# Patient Record
Sex: Female | Born: 1990 | Race: Black or African American | Hispanic: No | Marital: Single | Smoking: Never smoker
Health system: Southern US, Community
[De-identification: ages and names within clinical notes are randomized; demographics above are authoritative.]

## PROBLEM LIST (undated history)

## (undated) HISTORY — PX: WISDOM TOOTH EXTRACTION: SHX21

---
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
## 2012-05-20 ENCOUNTER — Emergency Department (HOSPITAL_BASED_OUTPATIENT_CLINIC_OR_DEPARTMENT_OTHER)
Admission: EM | Admit: 2012-05-20 | Discharge: 2012-05-20 | Disposition: A | Discharge status: Home / Self Care | Payer: Self-pay | Attending: Emergency Medicine | Admitting: Emergency Medicine

## 2012-05-20 ENCOUNTER — Encounter (HOSPITAL_BASED_OUTPATIENT_CLINIC_OR_DEPARTMENT_OTHER): Payer: Self-pay | Admitting: *Deleted

## 2012-05-20 DIAGNOSIS — R109 Unspecified abdominal pain: Secondary | ICD-10-CM | POA: Insufficient documentation

## 2012-05-20 LAB — COMPREHENSIVE METABOLIC PANEL
AST: 17 U/L (ref 0–37)
Albumin: 4 g/dL (ref 3.5–5.2)
BUN: 9 mg/dL (ref 6–23)
CO2: 22 mEq/L (ref 19–32)
Calcium: 9.2 mg/dL (ref 8.4–10.5)
Creatinine, Ser: 0.9 mg/dL (ref 0.50–1.10)
GFR calc non Af Amer: >90 mL/min (ref >90)

## 2012-05-20 LAB — URINALYSIS, ROUTINE W REFLEX MICROSCOPIC
Ketones, ur: NEGATIVE mg/dL
Leukocytes, UA: NEGATIVE
Nitrite: NEGATIVE
Protein, ur: NEGATIVE mg/dL

## 2012-05-20 LAB — URINE MICROSCOPIC-ADD ON

## 2012-05-20 LAB — CBC WITH DIFFERENTIAL/PLATELET
Basophils Absolute: 0 10*3/uL (ref 0.0–0.1)
Eosinophils Relative: 1 % (ref 0–5)
Lymphocytes Relative: 36 % (ref 12–46)
Lymphs Abs: 2.1 10*3/uL (ref 0.7–4.0)
MCV: 80.8 fL (ref 78.0–100.0)
Neutro Abs: 3.3 10*3/uL (ref 1.7–7.7)
Platelets: 289 10*3/uL (ref 150–400)
RBC: 4.47 MIL/uL (ref 3.87–5.11)
WBC: 5.7 10*3/uL (ref 4.0–10.5)

## 2012-05-20 LAB — LIPASE, BLOOD: Lipase: 54 U/L (ref 11–59)

## 2012-05-20 LAB — PREGNANCY, URINE: Preg Test, Ur: NEGATIVE

## 2012-05-20 MED ORDER — SODIUM CHLORIDE 0.9 % IV SOLN
1000.0000 mL | INTRAVENOUS | Status: DC
  Administered 2012-05-20: 1000 mL via INTRAVENOUS

## 2012-05-20 MED ORDER — OMEPRAZOLE 40 MG PO CPDR: 40.0000 mg | DELAYED_RELEASE_CAPSULE | Freq: Every day | ORAL | Status: DC

## 2012-05-20 MED ORDER — SODIUM CHLORIDE 0.9 % IV SOLN
1000.0000 mL | Freq: Once | INTRAVENOUS | Status: AC
  Administered 2012-05-20: 1000 mL via INTRAVENOUS

## 2012-05-20 NOTE — ED Provider Notes (Signed)
History     CSN: [id number] Arrival date & time 05/20/12  4098  First MD Initiated Contact with Patient 05/20/12 [phone]      Chief Complaint  Patient presents with  . Abdominal Pain    HPI Pt has been having issues with abdominal pain after eating for the last several months.  It comes and goes but usually after eating.  The pain is mostly located in the upper abdomen.  It does not radiate.  The pain lasts for 5-10 minutes. No fever, no cough.  She had an episode when she woke up this am so she came to the ED.  Pt has noticed some spotting while she has been on the depo shot but that did not bring her to the ED. History reviewed. No pertinent past medical history.  History reviewed. No pertinent past surgical history.  History reviewed. No pertinent family history.  History  Substance Use Topics  . Smoking status: Never Smoker   . Smokeless tobacco: Not on file  . Alcohol Use: No    OB History    Grav Para Term Preterm Abortions TAB SAB Ect Mult Living                  Review of Systems  All other systems reviewed and are negative.    Allergies  Review of patient's allergies indicates no known allergies.  Home Medications  No current outpatient prescriptions on file.  BP 111/61  Pulse 88  Temp 98.6 F (37 C) (Oral)  Resp 14  SpO2 100%  LMP 04/18/2012  Physical Exam  Nursing note and vitals reviewed. Constitutional: She appears well-developed and well-nourished. No distress.  HENT:  Head: Normocephalic and atraumatic.  Right Ear: External ear normal.  Left Ear: External ear normal.  Eyes: Conjunctivae normal are normal. Right eye exhibits no discharge. Left eye exhibits no discharge. No scleral icterus.  Neck: Neck supple. No tracheal deviation present.  Cardiovascular: Normal rate, regular rhythm and intact distal pulses.   Pulmonary/Chest: Effort normal and breath sounds normal. No stridor. No respiratory distress. She has no wheezes. She has no rales.    Abdominal: Soft. Bowel sounds are normal. She exhibits no distension. There is no tenderness. There is no rebound and no guarding.  Musculoskeletal: She exhibits no edema and no tenderness.  Neurological: She is alert. She has normal strength. No sensory deficit. Cranial nerve deficit:  no gross defecits noted. She exhibits normal muscle tone. She displays no seizure activity. Coordination normal.  Skin: Skin is warm and dry. No rash noted.  Psychiatric: She has a normal mood and affect.    ED Course  Procedures (including critical care time)  Labs Reviewed  URINALYSIS, ROUTINE W REFLEX MICROSCOPIC - Abnormal; Notable for the following:    APPearance CLOUDY (*)     Hgb urine dipstick LARGE (*)     All other components within normal limits  URINE MICROSCOPIC-ADD ON - Abnormal; Notable for the following:    Bacteria, UA FEW (*)     All other components within normal limits  PREGNANCY, URINE  COMPREHENSIVE METABOLIC PANEL  LIPASE, BLOOD  CBC WITH DIFFERENTIAL   No results found.   1. Abdominal pain       MDM  Abdominal pain Benign exam.  No ttp in the ED.  Related to foods.  Could be GERD, Gastritis, PUD.  Less likely gallbladder etiology.  At this time there does not appear to be any evidence of an acute emergency  medical condition and the patient appears stable for discharge with appropriate outpatient follow up.  Hematuria Related to her menstrual spotting.  Doubt renal colic. uti.         Celene Kras, MD 05/20/12 [phone]

## 2012-05-20 NOTE — ED Notes (Signed)
Pt states she started depo shots last month while on her period she reports has been having intermittent nausea since then and is spotting now. No emesis denies any urinary s/s

## 2013-01-05 ENCOUNTER — Emergency Department (HOSPITAL_COMMUNITY): Payer: Managed Care, Other (non HMO)

## 2013-01-05 ENCOUNTER — Encounter (HOSPITAL_COMMUNITY): Payer: Self-pay | Admitting: Emergency Medicine

## 2013-01-05 ENCOUNTER — Emergency Department (HOSPITAL_COMMUNITY)
Admission: EM | Admit: 2013-01-05 | Discharge: 2013-01-06 | Disposition: A | Discharge status: Home / Self Care | Payer: Managed Care, Other (non HMO) | Attending: Emergency Medicine | Admitting: Emergency Medicine

## 2013-01-05 DIAGNOSIS — R109 Unspecified abdominal pain: Secondary | ICD-10-CM

## 2013-01-05 DIAGNOSIS — M7989 Other specified soft tissue disorders: Secondary | ICD-10-CM | POA: Insufficient documentation

## 2013-01-05 DIAGNOSIS — R0789 Other chest pain: Secondary | ICD-10-CM | POA: Insufficient documentation

## 2013-01-05 DIAGNOSIS — R079 Chest pain, unspecified: Secondary | ICD-10-CM

## 2013-01-05 LAB — CBC
Hemoglobin: 14.1 g/dL (ref 12.0–15.0)
MCH: 30.5 pg (ref 26.0–34.0)
MCHC: 36.1 g/dL — ABNORMAL HIGH (ref 30.0–36.0)
Platelets: 307 10*3/uL (ref 150–400)
RDW: 13.2 % (ref 11.5–15.5)

## 2013-01-05 LAB — BASIC METABOLIC PANEL
Calcium: 9.3 mg/dL (ref 8.4–10.5)
GFR calc Af Amer: >90 mL/min (ref >90)
GFR calc non Af Amer: >90 mL/min (ref >90)
Potassium: 3.4 mEq/L — ABNORMAL LOW (ref 3.5–5.1)
Sodium: 137 mEq/L (ref 135–145)

## 2013-01-05 LAB — POCT I-STAT TROPONIN I: Troponin i, poc: 0 ng/mL (ref 0.00–0.08)

## 2013-01-05 MED ORDER — RANITIDINE HCL 150 MG PO TABS: 150.0000 mg | ORAL_TABLET | Freq: Two times a day (BID) | ORAL | Status: DC
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
## 2013-01-05 NOTE — ED Notes (Signed)
Patient with chest pain and new onset of ankle swelling.  Patient states that she travelled to [place] and came back about one week ago and notice the swelling in ankles.  Patient states she was not on any prophylaxis for any infectious diseases while in [place].  Patient states she was there for 6 weeks.  Patient states she is from [place].

## 2013-02-07 ENCOUNTER — Encounter (HOSPITAL_COMMUNITY): Payer: Self-pay | Admitting: Emergency Medicine

## 2013-02-07 ENCOUNTER — Emergency Department (HOSPITAL_COMMUNITY): Payer: Managed Care, Other (non HMO)

## 2013-02-07 ENCOUNTER — Emergency Department (HOSPITAL_COMMUNITY)
Admission: EM | Admit: 2013-02-07 | Discharge: 2013-02-07 | Disposition: A | Discharge status: Home / Self Care | Payer: Managed Care, Other (non HMO) | Attending: Emergency Medicine | Admitting: Emergency Medicine

## 2013-02-07 DIAGNOSIS — IMO0002 Reserved for concepts with insufficient information to code with codable children: Secondary | ICD-10-CM | POA: Insufficient documentation

## 2013-02-07 DIAGNOSIS — S3630XA Unspecified injury of stomach, initial encounter: Secondary | ICD-10-CM | POA: Insufficient documentation

## 2013-02-07 DIAGNOSIS — Z79899 Other long term (current) drug therapy: Secondary | ICD-10-CM | POA: Insufficient documentation

## 2013-02-07 DIAGNOSIS — Y92410 Unspecified street and highway as the place of occurrence of the external cause: Secondary | ICD-10-CM | POA: Insufficient documentation

## 2013-02-07 DIAGNOSIS — Y9389 Activity, other specified: Secondary | ICD-10-CM | POA: Insufficient documentation

## 2013-02-07 DIAGNOSIS — T148XXA Other injury of unspecified body region, initial encounter: Secondary | ICD-10-CM

## 2013-02-07 DIAGNOSIS — Z3202 Encounter for pregnancy test, result negative: Secondary | ICD-10-CM | POA: Insufficient documentation

## 2013-02-07 LAB — CBC WITH DIFFERENTIAL/PLATELET
Basophils Relative: 0 % (ref 0–1)
Eosinophils Absolute: 0.1 10*3/uL (ref 0.0–0.7)
Eosinophils Relative: 1 % (ref 0–5)
Hemoglobin: 14.2 g/dL (ref 12.0–15.0)
Lymphs Abs: 1.7 10*3/uL (ref 0.7–4.0)
MCH: 31 pg (ref 26.0–34.0)
MCHC: 35.9 g/dL (ref 30.0–36.0)
MCV: 86.5 fL (ref 78.0–100.0)
Monocytes Relative: 8 % (ref 3–12)
Neutrophils Relative %: 59 % (ref 43–77)
Platelets: 272 10*3/uL (ref 150–400)
RBC: 4.58 MIL/uL (ref 3.87–5.11)

## 2013-02-07 LAB — URINE MICROSCOPIC-ADD ON

## 2013-02-07 LAB — BASIC METABOLIC PANEL
BUN: 7 mg/dL (ref 6–23)
Calcium: 9.3 mg/dL (ref 8.4–10.5)
GFR calc Af Amer: >90 mL/min (ref >90)
GFR calc non Af Amer: 88 mL/min — ABNORMAL LOW (ref >90)
Glucose, Bld: 75 mg/dL (ref 70–99)

## 2013-02-07 LAB — URINALYSIS, ROUTINE W REFLEX MICROSCOPIC
Bilirubin Urine: NEGATIVE
Hgb urine dipstick: NEGATIVE
Ketones, ur: NEGATIVE mg/dL
Nitrite: NEGATIVE
Specific Gravity, Urine: 1.028 (ref 1.005–1.030)
Urobilinogen, UA: 1 mg/dL (ref 0.0–1.0)

## 2013-02-07 MED ORDER — IOHEXOL 300 MG/ML  SOLN
100.0000 mL | Freq: Once | INTRAMUSCULAR | Status: AC | PRN
  Administered 2013-02-07: 100 mL via INTRAVENOUS

## 2013-02-07 MED ORDER — HYDROCODONE-ACETAMINOPHEN 5-325 MG PO TABS: 1.0000 | ORAL_TABLET | Freq: Four times a day (QID) | ORAL | Status: DC | PRN
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
## 2013-02-07 NOTE — ED Notes (Signed)
Pt moved to POD E at [facility] request.
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
## 2013-02-08 NOTE — ED Provider Notes (Signed)
Medical screening examination/treatment/procedure(s) were performed by non-physician practitioner and as supervising physician I was immediately available for consultation/collaboration.  Ranelle Auker R. Laurel Smeltz, MD 02/08/13 1532 

## 2014-12-28 ENCOUNTER — Ambulatory Visit (INDEPENDENT_AMBULATORY_CARE_PROVIDER_SITE_OTHER): Payer: BLUE CROSS/BLUE SHIELD | Admitting: Family Medicine

## 2014-12-28 VITALS — BP 108/74 | HR 84 | Temp 98.7°F | Resp 16 | Ht 63.0 in | Wt 143.2 lb

## 2014-12-28 DIAGNOSIS — Z0000 Encounter for general adult medical examination without abnormal findings: Secondary | ICD-10-CM

## 2014-12-28 NOTE — Progress Notes (Signed)
Subjective:  This chart was scribed for Norberto Sorenson, MD by Stann Ore, Medical Scribe. This patient was seen in Room 10 and the patient's care was started 4:52 PM.    Patient ID: Christina Day, female    DOB: Nov 22, 1990, 24 y.o.   MRN: [id number] Chief Complaint  Patient presents with  . Employment Physical    HPI Christina Day is a 24 y.o. female who presents to [facility] for employment physical. 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  She has no medication problems prior. She's gotten her immunizations when she was young.  She's had a TB test yesterday done at [facility] in [facility], but she didn't have her physical forms until today. She doesn't recall having chicken pox when she was young. She denies any substance usage. Her last tetanus shot was in 2012.  She has an appointment with OB GYN next week for pap smear.   She grew up in [place].    History reviewed. No pertinent past medical history. Current Outpatient Prescriptions on File Prior to Visit  Medication Sig Dispense Refill  . MedroxyPROGESTERone Acetate (DEPO-PROVERA IM) Inject into the muscle every 3 (three) months.     No current facility-administered medications on file prior to visit.   No Known Allergies  History reviewed. No pertinent past surgical history. No family history on file. History   Social History  . Marital Status: Single    Spouse Name: N/A  . Number of Children: N/A  . Years of Education: N/A   Social History Main Topics  . Smoking status: Never Smoker   . Smokeless tobacco: Not on file  . Alcohol Use: No  . Drug Use: No  . Sexual Activity: Yes    Birth Control/ Protection: Injection   Other Topics Concern  . None   Social History Narrative     Review of Systems  Constitutional: Negative for fever, chills, diaphoresis and fatigue.  HENT: Negative for congestion, ear pain, rhinorrhea, sinus pressure, sneezing and sore throat.   Gastrointestinal: Negative for nausea, vomiting, diarrhea and  constipation.  Skin: Negative for rash and wound.  All other systems reviewed and are negative.      Objective:   Physical Exam  Constitutional: She is oriented to person, place, and time. She appears well-developed and well-nourished. No distress.  HENT:  Head: Normocephalic and atraumatic.  Right Ear: Tympanic membrane normal.  Left Ear: Tympanic membrane normal.  Nose: Nose normal.  Mouth/Throat: No oropharyngeal exudate.  Eyes: EOM are normal. Pupils are equal, round, and reactive to light.  Neck: Normal range of motion. Neck supple. No thyromegaly present.  Cardiovascular: Normal rate, regular rhythm, S1 normal, S2 normal and normal heart sounds.   Pulses:      Dorsalis pedis pulses are 2+ on the right side, and 2+ on the left side.  Pulmonary/Chest: Effort normal and breath sounds normal. No respiratory distress.  Musculoskeletal: Normal range of motion.  Lymphadenopathy:    She has no cervical adenopathy.  Neurological: She is alert and oriented to person, place, and time.  Reflex Scores:      Patellar reflexes are 2+ on the right side and 2+ on the left side.      Achilles reflexes are 2+ on the right side and 2+ on the left side. Skin: Skin is warm and dry.  Psychiatric: She has a normal mood and affect. Her behavior is normal.  Nursing note and vitals reviewed.   BP 108/74 mmHg  Pulse 84  Temp(Src) 98.7 F (37.1 C) (Oral)  Resp 16  Ht 5\' 3"  (1.6 m)  Wt 143 lb 3.2 oz (64.955 kg)  BMI 25.37 kg/m2  SpO2 97%  LMP 12/23/2014       Assessment & Plan:   1. Health maintenance examination   work forms completed, no restrictions, copy scanned.   I personally performed the services described in this documentation, which was scribed in my presence. The recorded information has been reviewed and considered, and addended by me as needed.  Norberto SorensonEva Cherrise Occhipinti, MD MPH

## 2014-12-28 NOTE — Patient Instructions (Signed)
We did bill for a full physical today since this was required by your work but no labs were done and neither wore a screening pelvic or breast exam so hopefully your gynecologist will be able to cover all of this - if any problems, please call.  Health Maintenance Adopting a healthy lifestyle and getting preventive care can go a long way to promote health and wellness. Talk with your health care provider about what schedule of regular examinations is right for you. This is a good chance for you to check in with your provider about disease prevention and staying healthy. In between checkups, there are plenty of things you can do on your own. Experts have done a lot of research about which lifestyle changes and preventive measures are most likely to keep you healthy. Ask your health care provider for more information. WEIGHT AND DIET  Eat a healthy diet  Be sure to include plenty of vegetables, fruits, low-fat dairy products, and lean protein.  Do not eat a lot of foods high in solid fats, added sugars, or salt.  Get regular exercise. This is one of the most important things you can do for your health.  Most adults should exercise for at least 150 minutes each week. The exercise should increase your heart rate and make you sweat (moderate-intensity exercise).  Most adults should also do strengthening exercises at least twice a week. This is in addition to the moderate-intensity exercise.  Maintain a healthy weight  Body mass index (BMI) is a measurement that can be used to identify possible weight problems. It estimates body fat based on height and weight. Your health care provider can help determine your BMI and help you achieve or maintain a healthy weight.  For females 73 years of age and older:   A BMI below 18.5 is considered underweight.  A BMI of 18.5 to 24.9 is normal.  A BMI of 25 to 29.9 is considered overweight.  A BMI of 30 and above is considered obese.  Watch levels of  cholesterol and blood lipids  You should start having your blood tested for lipids and cholesterol at 22 years of age, then have this test every 5 years.  You may need to have your cholesterol levels checked more often if:  Your lipid or cholesterol levels are high.  You are older than 22 years of age.  You are at high risk for heart disease.  CANCER SCREENING   Lung Cancer  Lung cancer screening is recommended for adults 81-63 years old who are at high risk for lung cancer because of a history of smoking.  A yearly low-dose CT scan of the lungs is recommended for people who:  Currently smoke.  Have quit within the past 15 years.  Have at least a 30-pack-year history of smoking. A pack year is smoking an average of one pack of cigarettes a day for 1 year.  Yearly screening should continue until it has been 15 years since you quit.  Yearly screening should stop if you develop a health problem that would prevent you from having lung cancer treatment.  Breast Cancer  Practice breast self-awareness. This means understanding how your breasts normally appear and feel.  It also means doing regular breast self-exams. Let your health care provider know about any changes, no matter how small.  If you are in your 20s or 30s, you should have a clinical breast exam (CBE) by a health care provider every 1-3 years as part of  regular health exam.  If you are 40 or older, have a CBE every year. Also consider having a breast X-ray (mammogram) every year.  If you have a family history of breast cancer, talk to your health care provider about genetic screening.  If you are at high risk for breast cancer, talk to your health care provider about having an MRI and a mammogram every year.  Breast cancer gene (BRCA) assessment is recommended for women who have family members with BRCA-related cancers. BRCA-related cancers include:  Breast.  Ovarian.  Tubal.  Peritoneal cancers.  Results of the  assessment will determine the need for genetic counseling and BRCA1 and BRCA2 testing. Cervical Cancer Routine pelvic examinations to screen for cervical cancer are no longer recommended for nonpregnant women who are considered low risk for cancer of the pelvic organs (ovaries, uterus, and vagina) and who do not have symptoms. A pelvic examination may be necessary if you have symptoms including those associated with pelvic infections. Ask your health care provider if a screening pelvic exam is right for you.   The Pap test is the screening test for cervical cancer for women who are considered at risk.  If you had a hysterectomy for a problem that was not cancer or a condition that could lead to cancer, then you no longer need Pap tests.  If you are older than 65 years, and you have had normal Pap tests for the past 10 years, you no longer need to have Pap tests.  If you have had past treatment for cervical cancer or a condition that could lead to cancer, you need Pap tests and screening for cancer for at least 20 years after your treatment.  If you no longer get a Pap test, assess your risk factors if they change (such as having a new sexual partner). This can affect whether you should start being screened again.  Some women have medical problems that increase their chance of getting cervical cancer. If this is the case for you, your health care provider may recommend more frequent screening and Pap tests.  The human papillomavirus (HPV) test is another test that may be used for cervical cancer screening. The HPV test looks for the virus that can cause cell changes in the cervix. The cells collected during the Pap test can be tested for HPV.  The HPV test can be used to screen women 30 years of age and older. Getting tested for HPV can extend the interval between normal Pap tests from three to five years.  An HPV test also should be used to screen women of any age who have unclear Pap test  results.  After 22 years of age, women should have HPV testing as often as Pap tests.  Colorectal Cancer  This type of cancer can be detected and often prevented.  Routine colorectal cancer screening usually begins at 22 years of age and continues through 22 years of age.  Your health care provider may recommend screening at an earlier age if you have risk factors for colon cancer.  Your health care provider may also recommend using home test kits to check for hidden blood in the stool.  A small camera at the end of a tube can be used to examine your colon directly (sigmoidoscopy or colonoscopy). This is done to check for the earliest forms of colorectal cancer.  Routine screening usually begins at age 50.  Direct examination of the colon should be repeated every 5-10 years through 22   years of age. However, you may need to be screened more often if early forms of precancerous polyps or small growths are found. Skin Cancer  Check your skin from head to toe regularly.  Tell your health care provider about any new moles or changes in moles, especially if there is a change in a mole's shape or color.  Also tell your health care provider if you have a mole that is larger than the size of a pencil eraser.  Always use sunscreen. Apply sunscreen liberally and repeatedly throughout the day.  Protect yourself by wearing long sleeves, pants, a wide-brimmed hat, and sunglasses whenever you are outside. HEART DISEASE, DIABETES, AND HIGH BLOOD PRESSURE   Have your blood pressure checked at least every 1-2 years. High blood pressure causes heart disease and increases the risk of stroke.  If you are between 55 years and 79 years old, ask your health care provider if you should take aspirin to prevent strokes.  Have regular diabetes screenings. This involves taking a blood sample to check your fasting blood sugar level.  If you are at a normal weight and have a low risk for diabetes, have this  test once every three years after 22 years of age.  If you are overweight and have a high risk for diabetes, consider being tested at a younger age or more often. PREVENTING INFECTION  Hepatitis B  If you have a higher risk for hepatitis B, you should be screened for this virus. You are considered at high risk for hepatitis B if:  You were born in a country where hepatitis B is common. Ask your health care provider which countries are considered high risk.  Your parents were born in a high-risk country, and you have not been immunized against hepatitis B (hepatitis B vaccine).  You have HIV or AIDS.  You use needles to inject street drugs.  You live with someone who has hepatitis B.  You have had sex with someone who has hepatitis B.  You get hemodialysis treatment.  You take certain medicines for conditions, including cancer, organ transplantation, and autoimmune conditions. Hepatitis C  Blood testing is recommended for:  Everyone born from 1945 through 1965.  Anyone with known risk factors for hepatitis C. Sexually transmitted infections (STIs)  You should be screened for sexually transmitted infections (STIs) including gonorrhea and chlamydia if:  You are sexually active and are younger than 22 years of age.  You are older than 22 years of age and your health care provider tells you that you are at risk for this type of infection.  Your sexual activity has changed since you were last screened and you are at an increased risk for chlamydia or gonorrhea. Ask your health care provider if you are at risk.  If you do not have HIV, but are at risk, it may be recommended that you take a prescription medicine daily to prevent HIV infection. This is called pre-exposure prophylaxis (PrEP). You are considered at risk if:  You are sexually active and do not regularly use condoms or know the HIV status of your partner(s).  You take drugs by injection.  You are sexually active with  a partner who has HIV. Talk with your health care provider about whether you are at high risk of being infected with HIV. If you choose to begin PrEP, you should first be tested for HIV. You should then be tested every 3 months for as long as you are taking PrEP.    PREGNANCY   If you are premenopausal and you may become pregnant, ask your health care provider about preconception counseling.  If you may become pregnant, take 400 to 800 micrograms (mcg) of folic acid every day.  If you want to prevent pregnancy, talk to your health care provider about birth control (contraception). OSTEOPOROSIS AND MENOPAUSE   Osteoporosis is a disease in which the bones lose minerals and strength with aging. This can result in serious bone fractures. Your risk for osteoporosis can be identified using a bone density scan.  If you are 44 years of age or older, or if you are at risk for osteoporosis and fractures, ask your health care provider if you should be screened.  Ask your health care provider whether you should take a calcium or vitamin D supplement to lower your risk for osteoporosis.  Menopause may have certain physical symptoms and risks.  Hormone replacement therapy may reduce some of these symptoms and risks. Talk to your health care provider about whether hormone replacement therapy is right for you.  HOME CARE INSTRUCTIONS   Schedule regular health, dental, and eye exams.  Stay current with your immunizations.   Do not use any tobacco products including cigarettes, chewing tobacco, or electronic cigarettes.  If you are pregnant, do not drink alcohol.  If you are breastfeeding, limit how much and how often you drink alcohol.  Limit alcohol intake to no more than 1 drink per day for nonpregnant women. One drink equals 12 ounces of beer, 5 ounces of wine, or 1 ounces of hard liquor.  Do not use street drugs.  Do not share needles.  Ask your health care provider for  help if you need support or information about quitting drugs.  Tell your health care provider if you often feel depressed.  Tell your health care provider if you have ever been abused or do not feel safe at home. Document Released: 12/23/2010 Document Revised: 10/24/2013 Document Reviewed: 05/11/2013 [facility] Patient Information 2015 [place], [place]. This information is not intended to replace advice given to you by your health care provider. Make sure you discuss any questions you have with your health care provider.

## 2015-03-13 ENCOUNTER — Ambulatory Visit (INDEPENDENT_AMBULATORY_CARE_PROVIDER_SITE_OTHER): Payer: BLUE CROSS/BLUE SHIELD

## 2015-03-13 ENCOUNTER — Ambulatory Visit (INDEPENDENT_AMBULATORY_CARE_PROVIDER_SITE_OTHER): Payer: BLUE CROSS/BLUE SHIELD | Admitting: Internal Medicine

## 2015-03-13 VITALS — BP 100/64 | HR 77 | Temp 98.4°F | Resp 16 | Ht 62.5 in | Wt 146.4 lb

## 2015-03-13 DIAGNOSIS — M79674 Pain in right toe(s): Secondary | ICD-10-CM

## 2015-03-13 DIAGNOSIS — L03031 Cellulitis of right toe: Secondary | ICD-10-CM | POA: Diagnosis not present

## 2015-03-13 DIAGNOSIS — S91201A Unspecified open wound of right great toe with damage to nail, initial encounter: Secondary | ICD-10-CM

## 2015-03-13 DIAGNOSIS — S91109A Unspecified open wound of unspecified toe(s) without damage to nail, initial encounter: Secondary | ICD-10-CM

## 2015-03-13 DIAGNOSIS — S91209A Unspecified open wound of unspecified toe(s) with damage to nail, initial encounter: Secondary | ICD-10-CM

## 2015-03-13 MED ORDER — DOXYCYCLINE HYCLATE 100 MG PO TABS: 100.0000 mg | ORAL_TABLET | Freq: Two times a day (BID) | ORAL | Status: DC

## 2015-03-13 MED ORDER — HYDROCODONE-ACETAMINOPHEN 5-325 MG PO TABS: 1.0000 | ORAL_TABLET | Freq: Four times a day (QID) | ORAL | Status: DC | PRN

## 2015-03-13 NOTE — Progress Notes (Signed)
Patient ID: Christina Day, female   DOB: [birth year], 24 y.o.   MRN: [id number]   03/13/2015 at 12:44 PM  Christina Day / DOB: 1991/05/31 / MRN: [id number]  Problem list reviewed and updated by me where necessary.   SUBJECTIVE  Christina Day is a 24 y.o. ill appearing female presenting for the chief complaint of injured toe. Furniture leg avulsed great toe nail, now infected..     She  has no past medical history on file.    Medications reviewed and updated by myself where necessary, and exist elsewhere in the encounter.   [name] has No Known Allergies. She  reports that she has never smoked. She does not have any smokeless tobacco history on file. She reports that she does not drink alcohol or use illicit drugs. She  reports that she currently engages in sexual activity. She reports using the following method of birth control/protection: Injection. The patient  has no past surgical history on file.  Her family history is not on file.  Review of Systems  Constitutional: Negative for fever.  Respiratory: Negative for shortness of breath.   Cardiovascular: Negative for chest pain.  Gastrointestinal: Negative for nausea.  Skin: Negative for rash.  Neurological: Negative for dizziness and headaches.    OBJECTIVE  Her  height is 5' 2.5" (1.588 m) and weight is 146 lb 6.4 oz (66.407 kg). Her oral temperature is 98.4 F (36.9 C). Her blood pressure is 100/64 and her pulse is 77. Her respiration is 16 and oxygen saturation is 98%.  The patient's body mass index is 26.33 kg/(m^2).  Physical Exam  Constitutional: She is oriented to person, place, and time. She appears well-developed and well-nourished.  HENT:  Head: Normocephalic.  Eyes: Conjunctivae are normal. No scleral icterus.  Neck: Normal range of motion.  Cardiovascular: Normal rate.   Respiratory: Effort normal.  GI: She exhibits no distension.  Musculoskeletal: She exhibits edema and tenderness.       Right foot: There is  decreased range of motion, tenderness, bony tenderness, swelling, crepitus, deformity and laceration. There is normal capillary refill.       Feet:  Great toe nail and acrylic avulsed dorsal 3 days ago Now distal nail bed weeping serous fluid and very tender  Neurological: She is alert and oriented to person, place, and time. Gait abnormal. Coordination normal.  Skin: Skin is warm and dry.  Psychiatric: She has a normal mood and affect.  Culture done, purulence expressed [facility] reading (PRIMARY) by  Dr.Guest possible distal phalanx fracture non displaced, soft tissue swelling around distal phalanx   No results found for this or any previous visit (from the past 24 hour(s)).  ASSESSMENT & PLAN  Scherrie was seen today for toe injury.  Diagnoses and all orders for this visit:  Wound, open, toe with complication, initial encounter -     Wound culture -     DG Toe Great Right; Future -     HYDROcodone-acetaminophen (NORCO/VICODIN) 5-325 MG per tablet; Take 1 tablet by mouth every 6 (six) hours as needed. -     doxycycline (VIBRA-TABS) 100 MG tablet; Take 1 tablet (100 mg total) by mouth 2 (two) times daily.  Great toe pain, right -     Wound culture -     DG Toe Great Right; Future -     HYDROcodone-acetaminophen (NORCO/VICODIN) 5-325 MG per tablet; Take 1 tablet by mouth every 6 (six) hours as needed. -     doxycycline (VIBRA-TABS) 100  MG tablet; Take 1 tablet (100 mg total) by mouth 2 (two) times daily.  Traumatic avulsion of nail plate of toe, initial encounter -     HYDROcodone-acetaminophen (NORCO/VICODIN) 5-325 MG per tablet; Take 1 tablet by mouth every 6 (six) hours as needed. -     doxycycline (VIBRA-TABS) 100 MG tablet; Take 1 tablet (100 mg total) by mouth 2 (two) times daily.  Cellulitis of toe, right -     HYDROcodone-acetaminophen (NORCO/VICODIN) 5-325 MG per tablet; Take 1 tablet by mouth every 6 (six) hours as needed. -     doxycycline (VIBRA-TABS) 100 MG tablet; Take 1  tablet (100 mg total) by mouth 2 (two) times daily.

## 2015-03-13 NOTE — Patient Instructions (Signed)
Cellulitis Cellulitis is an infection of the skin and the tissue beneath it. The infected area is usually red and tender. Cellulitis occurs most often in the arms and lower legs.  CAUSES  Cellulitis is caused by bacteria that enter the skin through cracks or cuts in the skin. The most common types of bacteria that cause cellulitis are staphylococci and streptococci. SIGNS AND SYMPTOMS   Redness and warmth.  Swelling.  Tenderness or pain.  Fever. DIAGNOSIS  Your health care provider can usually determine what is wrong based on a physical exam. Blood tests may also be done. TREATMENT  Treatment usually involves taking an antibiotic medicine. HOME CARE INSTRUCTIONS   Take your antibiotic medicine as directed by your health care provider. Finish the antibiotic even if you start to feel better.  Keep the infected arm or leg elevated to reduce swelling.  Apply a warm cloth to the affected area up to 4 times per day to relieve pain.  Take medicines only as directed by your health care provider.  Keep all follow-up visits as directed by your health care provider. SEEK MEDICAL CARE IF:   You notice red streaks coming from the infected area.  Your red area gets larger or turns dark in color.  Your bone or joint underneath the infected area becomes painful after the skin has healed.  Your infection returns in the same area or another area.  You notice a swollen bump in the infected area.  You develop new symptoms.  You have a fever. SEEK IMMEDIATE MEDICAL CARE IF:   You feel very sleepy.  You develop vomiting or diarrhea.  You have a general ill feeling (malaise) with muscle aches and pains. MAKE SURE YOU:   Understand these instructions.  Will watch your condition.  Will get help right away if you are not doing well or get worse. Document Released: 03/19/2005 Document Revised: 10/24/2013 Document Reviewed: 08/25/2011 [facility] Patient Information 2015 [place], [place].  This information is not intended to replace advice given to you by your health care provider. Make sure you discuss any questions you have with your health care provider. Fingernail or Toenail Loss All or part of your fingernail or toenail has been lost. This may or may not grow back as a normal nail. A special non-stick bandage has been put on your finger or toe tightly to prevent bleeding. HOME CARE INSTRUCTIONS  The tips of fingers and toes are full of nerves and injuries are often very painful. The following will help you decrease the pain and obtain the best outcome.  Keep your hand or foot elevated above your heart to relieve pain and swelling. This will require lying in bed or on a couch with the hand or leg on pillows or sitting in a recliner with the leg up. Letting your hand or leg dangle may increase swelling, slow healing and cause throbbing pain.  Keep your dressing dry and clean.  Change your bandage in 24 hours after going home.  After your bandage is changed, soak your hand or foot in warm soapy water for 10 to 20 minutes. Do this 3 times per day. This helps reduce pain and swelling. After soaking, apply a clean, dry bandage. Change your bandage if it is wet or dirty.  Only take over-the-counter or prescription medicines for pain, discomfort, or fever as directed by your caregiver.  See your caregiver as needed for problems. SEEK IMMEDIATE MEDICAL CARE IF:   You have increased pain, swelling, drainage, or  bleeding.  You have a fever. MAKE SURE YOU:   Understand these instructions.  Will watch your condition.  Will get help right away if you are not doing well or get worse. Document Released: 05/01/2006 Document Revised: 09/01/2011 Document Reviewed: 07/21/2006 [facility] Patient Information 2015 [place], [place]. This information is not intended to replace advice given to you by your health care provider. Make sure you discuss any questions you have with your health care  provider.

## 2015-03-15 ENCOUNTER — Ambulatory Visit (INDEPENDENT_AMBULATORY_CARE_PROVIDER_SITE_OTHER): Payer: BLUE CROSS/BLUE SHIELD | Admitting: Physician Assistant

## 2015-03-15 VITALS — BP 122/88 | HR 75 | Temp 98.5°F | Resp 16

## 2015-03-15 DIAGNOSIS — S91109D Unspecified open wound of unspecified toe(s) without damage to nail, subsequent encounter: Secondary | ICD-10-CM

## 2015-03-15 LAB — WOUND CULTURE
GRAM STAIN: NONE SEEN
GRAM STAIN: NONE SEEN
Gram Stain: NONE SEEN

## 2015-03-20 NOTE — Progress Notes (Signed)
   Subjective:    Patient ID: Christina Day, female    DOB: 01/07/1991, 24 y.o.   MRN: [id number]  HPI Patient presents for wound care following right great toenail removal 2 days ago. Has been soaking toe twice a day and has not taken antibiotic. Denies swelling, redness, or drainage. States that toe is still tender, but not as bad previous. Has taken ibuprofen for pain. Keeps toe covered. NKDA.   Review of Systems  Constitutional: Negative for fever and chills.  Skin: Positive for wound. Negative for color change.  Neurological: Negative for numbness.       Objective:   Physical Exam  Constitutional: She is oriented to person, place, and time. She appears well-developed and well-nourished. No distress.  Blood pressure 122/88, pulse 75, temperature 98.5 F (36.9 C), temperature source Oral, resp. rate 16, SpO2 98 %.  HENT:  Head: Normocephalic and atraumatic.  Right Ear: External ear normal.  Left Ear: External ear normal.  Eyes: Conjunctivae are normal. Right eye exhibits no discharge. Left eye exhibits no discharge. No scleral icterus.  Pulmonary/Chest: Effort normal.  Neurological: She is alert and oriented to person, place, and time.  Skin: Skin is warm and dry. No rash noted. She is not diaphoretic. No erythema. No pallor.  Nailbed looks great. No purulence expressed. No erythema. Tender to palpation.   Psychiatric: She has a normal mood and affect. Her behavior is normal. Judgment and thought content normal.       Assessment & Plan:  1. Wound, open, toe with complication, subsequent encounter Wound healing well. Continue to keep covered with activity and soak daily. Allow to breath and be uncovered when just at rest. No additional f/u needed. Nail will grow back in 3-6 months.   Janan Ridge PA-C  [facility] 03/20/2015 9:11 AM

## 2015-09-29 ENCOUNTER — Emergency Department (HOSPITAL_COMMUNITY)
Admission: EM | Admit: 2015-09-29 | Discharge: 2015-09-29 | Disposition: A | Discharge status: Home / Self Care | Payer: BLUE CROSS/BLUE SHIELD | Attending: Emergency Medicine | Admitting: Emergency Medicine

## 2015-09-29 ENCOUNTER — Encounter (HOSPITAL_COMMUNITY): Payer: Self-pay | Admitting: Emergency Medicine

## 2015-09-29 DIAGNOSIS — Y998 Other external cause status: Secondary | ICD-10-CM | POA: Diagnosis not present

## 2015-09-29 DIAGNOSIS — Y9389 Activity, other specified: Secondary | ICD-10-CM | POA: Diagnosis not present

## 2015-09-29 DIAGNOSIS — S0990XA Unspecified injury of head, initial encounter: Secondary | ICD-10-CM | POA: Insufficient documentation

## 2015-09-29 DIAGNOSIS — Z792 Long term (current) use of antibiotics: Secondary | ICD-10-CM | POA: Insufficient documentation

## 2015-09-29 DIAGNOSIS — S199XXA Unspecified injury of neck, initial encounter: Secondary | ICD-10-CM | POA: Diagnosis not present

## 2015-09-29 DIAGNOSIS — Y92410 Unspecified street and highway as the place of occurrence of the external cause: Secondary | ICD-10-CM | POA: Insufficient documentation

## 2015-09-29 MED ORDER — METOCLOPRAMIDE HCL 10 MG PO TABS: 10.0000 mg | ORAL_TABLET | Freq: Three times a day (TID) | ORAL | Status: DC | PRN

## 2015-09-29 MED ORDER — METOCLOPRAMIDE HCL 10 MG PO TABS
10.0000 mg | ORAL_TABLET | Freq: Once | ORAL | Status: AC
  Administered 2015-09-29: 10 mg via ORAL
  Filled 2015-09-29: qty 1

## 2015-09-29 MED ORDER — KETOROLAC TROMETHAMINE 60 MG/2ML IM SOLN
60.0000 mg | Freq: Once | INTRAMUSCULAR | Status: DC
  Filled 2015-09-29: qty 2
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
## 2015-12-04 ENCOUNTER — Encounter: Payer: Self-pay | Admitting: Sports Medicine

## 2015-12-04 ENCOUNTER — Ambulatory Visit (INDEPENDENT_AMBULATORY_CARE_PROVIDER_SITE_OTHER): Payer: BLUE CROSS/BLUE SHIELD | Admitting: Sports Medicine

## 2015-12-04 ENCOUNTER — Ambulatory Visit: Payer: BLUE CROSS/BLUE SHIELD | Admitting: Diagnostic Neuroimaging

## 2015-12-04 VITALS — BP 114/69 | HR 110 | Resp 16 | Wt 147.8 lb

## 2015-12-04 DIAGNOSIS — G43909 Migraine, unspecified, not intractable, without status migrainosus: Secondary | ICD-10-CM | POA: Insufficient documentation

## 2015-12-04 DIAGNOSIS — G43709 Chronic migraine without aura, not intractable, without status migrainosus: Secondary | ICD-10-CM

## 2015-12-04 MED ORDER — TOPIRAMATE 50 MG PO TABS: ORAL_TABLET | ORAL | Status: DC

## 2015-12-04 MED ORDER — RIZATRIPTAN BENZOATE 10 MG PO TBDP: 10.0000 mg | ORAL_TABLET | ORAL | Status: DC | PRN
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
## 2015-12-04 NOTE — Patient Instructions (Signed)
Migraine Headache A migraine headache is an intense, throbbing pain on one or both sides of your head. A migraine can last for 30 minutes to several hours. CAUSES  The exact cause of a migraine headache is not always known. However, a migraine may be caused when nerves in the brain become irritated and release chemicals that cause inflammation. This causes pain. Certain things may also trigger migraines, such as:  Alcohol.  Smoking.  Stress.  Menstruation.  Aged cheeses.  Foods or drinks that contain nitrates, glutamate, aspartame, or tyramine.  Lack of sleep.  Chocolate.  Caffeine.  Hunger.  Physical exertion.  Fatigue.  Medicines used to treat chest pain (nitroglycerine), birth control pills, estrogen, and some blood pressure medicines. SIGNS AND SYMPTOMS  Pain on one or both sides of your head.  Pulsating or throbbing pain.  Severe pain that prevents daily activities.  Pain that is aggravated by any physical activity.  Nausea, vomiting, or both.  Dizziness.  Pain with exposure to bright lights, loud noises, or activity.  General sensitivity to bright lights, loud noises, or smells. Before you get a migraine, you may get warning signs that a migraine is coming (aura). An aura may include:  Seeing flashing lights.  Seeing bright spots, halos, or zigzag lines.  Having tunnel vision or blurred vision.  Having feelings of numbness or tingling.  Having trouble talking.  Having muscle weakness. DIAGNOSIS  A migraine headache is often diagnosed based on:  Symptoms.  Physical exam.  A CT scan or MRI of your head. These imaging tests cannot diagnose migraines, but they can help rule out other causes of headaches. TREATMENT Medicines may be given for pain and nausea. Medicines can also be given to help prevent recurrent migraines.  HOME CARE INSTRUCTIONS  Only take over-the-counter or prescription medicines for pain or discomfort as directed by your  health care provider. The use of long-term narcotics is not recommended.  Lie down in a dark, quiet room when you have a migraine.  Keep a journal to find out what may trigger your migraine headaches. For example, write down:  What you eat and drink.  How much sleep you get.  Any change to your diet or medicines.  Limit alcohol consumption.  Quit smoking if you smoke.  Get 7-9 hours of sleep, or as recommended by your health care provider.  Limit stress.  Keep lights dim if bright lights bother you and make your migraines worse. SEEK IMMEDIATE MEDICAL CARE IF:   Your migraine becomes severe.  You have a fever.  You have a stiff neck.  You have vision loss.  You have muscular weakness or loss of muscle control.  You start losing your balance or have trouble walking.  You feel faint or pass out.  You have severe symptoms that are different from your first symptoms. MAKE SURE YOU:   Understand these instructions.  Will watch your condition.  Will get help right away if you are not doing well or get worse.   This information is not intended to replace advice given to you by your health care provider. Make sure you discuss any questions you have with your health care provider.   Document Released: 06/09/2005 Document Revised: 06/30/2014 Document Reviewed: 02/14/2013 Elsevier Interactive Patient Education 2016 Elsevier Inc.  
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
## 2016-01-01 ENCOUNTER — Ambulatory Visit: Payer: BLUE CROSS/BLUE SHIELD | Admitting: Sports Medicine

## 2016-01-22 ENCOUNTER — Ambulatory Visit: Payer: BLUE CROSS/BLUE SHIELD | Admitting: Diagnostic Neuroimaging

## 2016-01-22 ENCOUNTER — Encounter: Payer: Self-pay | Admitting: Medical

## 2016-01-22 ENCOUNTER — Telehealth: Payer: Self-pay | Admitting: Medical

## 2016-01-22 ENCOUNTER — Ambulatory Visit (INDEPENDENT_AMBULATORY_CARE_PROVIDER_SITE_OTHER): Payer: BLUE CROSS/BLUE SHIELD | Admitting: Medical

## 2016-01-22 VITALS — BP 120/74 | HR 92 | Wt 149.0 lb

## 2016-01-22 DIAGNOSIS — F411 Generalized anxiety disorder: Secondary | ICD-10-CM | POA: Diagnosis not present

## 2016-01-22 DIAGNOSIS — Z559 Problems related to education and literacy, unspecified: Secondary | ICD-10-CM

## 2016-01-22 DIAGNOSIS — R41840 Attention and concentration deficit: Secondary | ICD-10-CM | POA: Diagnosis not present

## 2016-01-22 NOTE — Progress Notes (Signed)
Subjective:     Patient ID: Christina Day, female   DOB: 05-Nov-1990, 25 y.o.   MRN: [id number]  HPI Here as a new patient today.    No recent PCP.  She notes problems with concentration, focus issues in class.  No prior eval for this.   She attends [employer]. Was at [employer] prior.   Has been on academic probation the last 2 years.  Has had no remediation courses, advisor consult, but had completed study skills class at school.  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   She denies just being lazy.   She notes that she works hard, does a lot of studying, but can't seem to stay on task and focus.   Can be sitting in class, not paying attention, before she knows it, class will be over.  Procrastinates.   She notes when studying on her computer, wile have multiple tabs open.   Not accomplishing things.     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  teachers spoon fed material in high school.  Made B-Cs in high school, but made As in easy classes like PE or orchestra.   Ran track but never finished a season.   Had family problems in high school.  Parents divorced senior year.   elementary was ok.   She started having problems with academics in middle school.  Terrible grades through middle and high school.  She ended up withdrawing from [employer] due to academics.   She denies family hx/o ADD, learning disability or other.  However, she notes that in her culture being from [place], this wouldn't be something anyone would talk about.   She has a younger brother who does fine.  Her older sister is a Print production planner, and she doesn't like being compared to her by her mother.   Feels anxiety over not finishing school already and having too high of bar to reach per parents.   No hx/o mental healthy issues in general.    She notes exercising regularly, eats healthy, not currently in a relationship.    Diet - African, occasional soda, no sweets.   Not much caffeine.  No significant prior medical problems.    Review of Systems     Objective:   Physical Exam  Gen: wd, wn, nad, AA female Psych: pleasant good eye contact, answers questions appropriately         Assessment:     Encounter Diagnoses  Name Primary?  . Attention deficit Yes  . Anxiety state   . School problem        Plan:     discussed concerns, symptoms, Adult ADHD screen by CMA.   Given no prior eval, and given struggles in middle and high school, and now college, will refer to psychology for formal evaluation for school difficulties, anxiety.   Discussed possible diagnoses, having and advisors, working on finding optimal study habits, eating healthy diet, exercising, and working towards her goals.   Advised she sign HIPPA with both [place] and psychology so we can communicate and provide continuity of care.  Christina Day was seen today for adhd.  Diagnoses and all orders for this visit:  Attention deficit  Anxiety state  School problem  Other orders -     Ambulatory referral to Psychology

## 2016-01-22 NOTE — Telephone Encounter (Signed)
Let me know who we got her in with, [facility] or [facility]? And when is appt date.

## 2016-01-22 NOTE — Patient Instructions (Addendum)
  [facility] Address: [address] [place], [place] [address] Phone: [phone]  [facility] [address] [place], [place], [place] [address] Phone: [phone]  [facility] [phone] [facility], [address], [place], [place] [address]   Dr. Andee Poles, psychiatry [phone]  [email or web address] [address], [place], [place] [address]

## 2016-01-23 NOTE — Telephone Encounter (Signed)
LMTCB with [facility]

## 2016-01-30 ENCOUNTER — Telehealth: Payer: Self-pay | Admitting: Medical

## 2016-01-30 NOTE — Telephone Encounter (Signed)
Pt called and states that she cannot afford to go to [facility], she has not meet her deteuctible  and would have to pay out of the pocket of around 1200 and pay them by the hour and was wondering what she could do next, she can be reached at [phone] (H)

## 2016-01-31 NOTE — Telephone Encounter (Signed)
I recommend she call a few other providers to see what their insurance/copay arrangements are unless she has to meet deductible regardless where she goes.  Other good options include:  [facility] [phone] [facility], [address], [place], [place] [address]  [facility] [phone] office [email or web address] [address] [place], [place] [address]  Dr. Milagros Evenerupinder Kaur, psychiatry [phone] [address], [place], [place] [address]  [facility]

## 2016-01-31 NOTE — Telephone Encounter (Signed)
lmtcb

## 2016-02-01 ENCOUNTER — Telehealth: Payer: Self-pay

## 2016-02-01 NOTE — Telephone Encounter (Signed)
If she is starting back to school next week, I would recommend she call student health about evaluation for ADD.   Another resource is the ADD clinic at [facility].   Since she had difficulty with finances, if she would like, we could use a trial of medication to help with focus in the meantime.   Doe she want to try this?

## 2016-02-01 NOTE — Telephone Encounter (Signed)
Information has been relayed to pt- she states that these services are not covered under her insurance until she meets her deductible. She will call the numbers provided to see what there payment plans and rates are. Trixie Rude/RLB

## 2016-02-01 NOTE — Telephone Encounter (Signed)
Pt states that she called all the numbers as advised and [facility] at [facility], [facility]- they want $500 up front, and the other doctor is not available until Oct. She states she is moving next week and wants to know what her other options are.

## 2016-02-01 NOTE — Telephone Encounter (Signed)
I would check with [facility] or [facility].   She can also call around other local counseling office.   [facility] does a sliding scale.  I am sorry she is having this problem but just ask her to call several and let them know about her financial situation.

## 2016-02-01 NOTE — Telephone Encounter (Signed)
pls see about referring to [facility] or [facility], see what requirements they have or the limitations we are running into.

## 2016-02-01 NOTE — Telephone Encounter (Signed)
LMTCB

## 2016-02-01 NOTE — Telephone Encounter (Signed)
Her insurance stated she has not met her deductible and so she will have to pay for all of it out of pocket. Is there a place that wont make it be paid up front because all the places listed below want money up front.

## 2016-02-01 NOTE — Telephone Encounter (Signed)
Lmtcb.

## 2016-02-04 ENCOUNTER — Other Ambulatory Visit: Payer: Self-pay | Admitting: Medical

## 2016-02-04 MED ORDER — METHYLPHENIDATE HCL 10 MG PO TABS: 10.0000 mg | ORAL_TABLET | Freq: Two times a day (BID) | ORAL | 0 refills | Status: DC

## 2016-02-04 NOTE — Telephone Encounter (Signed)
Pt stated that yes to the medication and will check into the ADD clinic.

## 2016-02-04 NOTE — Telephone Encounter (Signed)
Pt is aware and will get script tomoroow

## 2016-02-04 NOTE — Telephone Encounter (Signed)
Begin trial of Methylphenidate either once daily in the morning or BID on days she has longer work or study time.  F/u 3-4 wk.  Script printed

## 2016-02-07 ENCOUNTER — Telehealth: Payer: Self-pay | Admitting: Medical

## 2016-02-07 NOTE — Telephone Encounter (Signed)
P.A. METHYLPHENIDATE approved til 02/07/19, faxed pharmacy, left message for pt

## 2016-02-27 ENCOUNTER — Encounter: Payer: Self-pay | Admitting: Medical

## 2016-02-27 ENCOUNTER — Ambulatory Visit (INDEPENDENT_AMBULATORY_CARE_PROVIDER_SITE_OTHER): Payer: BLUE CROSS/BLUE SHIELD | Admitting: Medical

## 2016-02-27 VITALS — BP 112/70 | HR 77 | Resp 18 | Wt 147.6 lb

## 2016-02-27 DIAGNOSIS — F411 Generalized anxiety disorder: Secondary | ICD-10-CM

## 2016-02-27 DIAGNOSIS — R41840 Attention and concentration deficit: Secondary | ICD-10-CM | POA: Diagnosis not present

## 2016-02-27 DIAGNOSIS — Z559 Problems related to education and literacy, unspecified: Secondary | ICD-10-CM

## 2016-02-27 DIAGNOSIS — G43709 Chronic migraine without aura, not intractable, without status migrainosus: Secondary | ICD-10-CM | POA: Diagnosis not present

## 2016-02-27 MED ORDER — AMPHETAMINE-DEXTROAMPHETAMINE 20 MG PO TABS: 20.0000 mg | ORAL_TABLET | Freq: Two times a day (BID) | ORAL | 0 refills | Status: DC

## 2016-02-27 NOTE — Progress Notes (Signed)
Subjective:     Patient ID: Christina Day, female   DOB: [birth year], 25 y.o.   MRN: [id number]  HPI Here for f/u.  I saw her recently as a new patient.   Last visit we discuss concern for attention deficit school problems.  Since last visit she had considerable difficulty getting an appt with psychology and learned her insurance coverage for this was not great.  Thus we gave a trial of methylphenidate to see if she would get improvement on her symptoms.  So far on her 2-3 week trial of Methylphenidate 10mg , she sees no effect and is actually getting belly pains the first 30-60 minutes after taking the medication and this has been consistent.   Since last visit she saw neurology regarding headaches/migraines and she started back to school at [employer].    From last visit's history:  She notes problems with concentration, focus issues in class.  No prior eval for this.   She attends [employer]. Was at [employer] prior.   Has been on academic probation the last 2 years.  Has had no remediation courses, advisor consult, but had completed study skills class at school.  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   She denies just being lazy.   She notes that she works hard, does a lot of studying, but can't seem to stay on task and focus.   Can be sitting in class, not paying attention, before she knows it, class will be over.  Procrastinates.   She notes when studying on her computer, wile have multiple tabs open.   Not accomplishing things.     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  teachers spoon fed material in high school.  Made B-Cs in high school, but made As in easy classes like PE or orchestra.   Ran track but never finished a season.   Had family problems in high school.  Parents divorced senior year.   elementary was ok.   She started having problems with academics in  middle school.  Terrible grades through middle and high school.  She ended up withdrawing from [employer] due to academics.   She denies family hx/o ADD, learning disability or other.  However, she notes that in her culture being from [place], this wouldn't be something anyone would talk about.   She has a younger brother who does fine.  Her older sister is a Print production planner, and she doesn't like being compared to her by her mother.   Feels anxiety over not finishing school already and having too high of bar to reach per parents.   No hx/o mental healthy issues in general.    She notes exercising regularly, eats healthy, not currently in a relationship.   Diet - African, occasional soda, no sweets.   Not much caffeine.  No significant prior medical problems.    Sometimes mind races at night, can't get to sleep.  Does note some anxiety particular with upcoming tests.   Denies depression, drug use.  Review of Systems    No current outpatient prescriptions on file prior to visit.   No current facility-administered medications on file prior to visit.         Objective:   Physical Exam  Gen: wd, wn, nad, AA female Psych: pleasant good eye contact, answers questions appropriately  Assessment:     Encounter Diagnoses  Name Primary?  . Attention deficit Yes  . Anxiety state   . School problem   . Chronic migraine without aura without status migrainosus, not intractable        Plan:     discussed concerns, symptoms, Adult ADHD screen from last visit.  She has f/u scheduled with psychology and counseling through [facility] health [facility] [employer].  She didn't do particular well with trial of Methylphenidate.   Begin trial of Adderall.  discussed risks/benefits of medication, proper use.    Discussed finding optimal study habits, eating healthy diet, exercising, and working towards her goals.  Reviewed her recent neurology consult notes.     Leila was seen today for  follow-up.  Diagnoses and all orders for this visit:  Attention deficit  Anxiety state  School problem  Chronic migraine without aura without status migrainosus, not intractable  Other orders -     amphetamine-dextroamphetamine (ADDERALL) 20 MG tablet; Take 1 tablet (20 mg total) by mouth 2 (two) times daily.  f/u 59mo

## 2016-03-03 ENCOUNTER — Telehealth: Payer: Self-pay | Admitting: Medical

## 2016-03-03 NOTE — Telephone Encounter (Signed)
P.A. AMPHETAMINE-DEXTROAMPHETAMINE  °

## 2016-03-03 NOTE — Telephone Encounter (Signed)
P.A. Approved til 02/28/19

## 2016-03-04 NOTE — Telephone Encounter (Signed)
Left message for pt & faxed pharmacy 

## 2016-03-07 ENCOUNTER — Ambulatory Visit: Payer: BLUE CROSS/BLUE SHIELD | Admitting: Psychology

## 2016-03-07 ENCOUNTER — Telehealth: Payer: Self-pay

## 2016-03-07 NOTE — Telephone Encounter (Signed)
Error

## 2016-03-11 ENCOUNTER — Ambulatory Visit (INDEPENDENT_AMBULATORY_CARE_PROVIDER_SITE_OTHER): Payer: BLUE CROSS/BLUE SHIELD | Admitting: Urgent Care

## 2016-03-11 VITALS — BP 116/80 | HR 90 | Temp 98.9°F | Resp 17 | Ht 63.0 in | Wt 148.0 lb

## 2016-03-11 DIAGNOSIS — Z111 Encounter for screening for respiratory tuberculosis: Secondary | ICD-10-CM

## 2016-03-11 DIAGNOSIS — Z23 Encounter for immunization: Secondary | ICD-10-CM

## 2016-03-11 DIAGNOSIS — Z0000 Encounter for general adult medical examination without abnormal findings: Secondary | ICD-10-CM | POA: Diagnosis not present

## 2016-03-11 LAB — POCT CBC
GRANULOCYTE PERCENT: 51.9 % (ref 37–80)
HEMATOCRIT: 39.4 % (ref 37.7–47.9)
HEMOGLOBIN: 13.7 g/dL (ref 12.2–16.2)
Lymph, poc: 3.1 (ref 0.6–3.4)
MCH, POC: 28.8 pg (ref 27–31.2)
MCHC: 34.9 g/dL (ref 31.8–35.4)
MCV: 82.5 fL (ref 80–97)
MID (cbc): 0.4 (ref 0–0.9)
MPV: 7.5 fL (ref 0–99.8)
POC GRANULOCYTE: 3.7 (ref 2–6.9)
POC LYMPH PERCENT: 43.2 %L (ref 10–50)
POC MID %: 4.9 %M (ref 0–12)
Platelet Count, POC: 322 10*3/uL (ref 142–424)
RBC: 4.77 M/uL (ref 4.04–5.48)
RDW, POC: 13.7 %
WBC: 7.2 10*3/uL (ref 4.6–10.2)

## 2016-03-11 NOTE — Progress Notes (Signed)
MRN: [id number]  Subjective:   [name] is a 25 y.o. female presenting for annual physical exam and tuberculosis screening.  Medical care team includes: PCP: Ernst BreachYSINGER, DAVID SHANE, PA-C Vision: No visual deficits. No eye exams recently. Dental: Cleanings every 6 months. [facility]. Last pap smear was 2016, was normal. Specialists: None.  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 She I required to have tuberculosis screening and completed this in 12/2015 through her school but they refused to fill out her current form. There was insufficient information for me to complete this and thus patient agreed to quantiferon testing. Patient is currently single, tries to eat healthily, does not exercise. Denies smoking cigarettes or drinking alcohol. Declines any labs not required by her school form.   Christina Day has a current medication list which includes the following prescription(s): amphetamine-dextroamphetamine. She has No Known Allergies.  Christina Day  has no past medical history on file. Also  has a past surgical history that includes Wisdom tooth extraction.  Denies family history of cancer, diabetes, HTN, HL, heart disease, stroke, mental illness.   Immunizations: Declines flu vaccine. TDAP will be updated today.  Review of Systems  Constitutional: Negative for chills, diaphoresis, fever, malaise/fatigue and weight loss.  HENT: Negative for congestion, ear discharge, ear pain, hearing loss, nosebleeds, sore throat and tinnitus.   Eyes: Negative for blurred vision, double vision, photophobia, pain, discharge and redness.  Respiratory: Negative for cough, shortness of breath and wheezing.   Cardiovascular: Negative for chest pain, palpitations and leg swelling.  Gastrointestinal: Negative for abdominal pain, blood in stool, constipation, diarrhea, nausea and vomiting.  Genitourinary: Negative for dysuria, flank pain, frequency, hematuria and urgency.    Musculoskeletal: Negative for back pain, joint pain and myalgias.  Skin: Negative for itching and rash.  Neurological: Negative for dizziness, tingling, seizures, loss of consciousness, weakness and headaches.  Endo/Heme/Allergies: Negative for polydipsia.  Psychiatric/Behavioral: Negative for depression, hallucinations, memory loss, substance abuse and suicidal ideas. The patient is not nervous/anxious and does not have insomnia.     Objective:   Vitals: BP 116/80 (BP Location: Right Arm, Patient Position: Sitting, Cuff Size: Normal)   Pulse 90   Temp 98.9 F (37.2 C) (Oral)   Resp 17   Ht 5\' 3"  (1.6 m)   Wt 148 lb (67.1 kg)   LMP 12/22/2015   SpO2 100%   BMI 26.22 kg/m    Visual Acuity Screening   Right eye Left eye Both eyes  Without correction: 20/13 20/13 20/13   With correction:       Physical Exam  Constitutional: She is oriented to person, place, and time. She appears well-developed and well-nourished.  HENT:  TM's intact bilaterally, no effusions or erythema. Nasal turbinates pink and moist, nasal passages patent. No sinus tenderness. Oropharynx clear, mucous membranes moist, dentition in good repair.  Eyes: Conjunctivae and EOM are normal. Pupils are equal, round, and reactive to light. Right eye exhibits no discharge. Left eye exhibits no discharge. No scleral icterus.  Neck: Normal range of motion. Neck supple. No thyromegaly present.  Cardiovascular: Normal rate, regular rhythm and intact distal pulses.  Exam reveals no gallop and no friction rub.   No murmur heard. Pulmonary/Chest: No respiratory distress. She has no wheezes. She has no rales.  Abdominal: Soft. Bowel sounds are normal. She exhibits no distension and no mass. There is no tenderness.  Musculoskeletal: Normal range of motion. She exhibits no edema or tenderness.  Lymphadenopathy:  She has no cervical adenopathy.  Neurological: She is alert and oriented to person, place, and time. She has normal  reflexes.  Skin: Skin is warm and dry. No rash noted. No erythema. No pallor.  Psychiatric: She has a normal mood and affect.   Results for orders placed or performed in visit on 03/11/16 (from the past 24 hour(s))  POCT CBC     Status: None   Collection Time: 03/11/16  6:57 PM  Result Value Ref Range   WBC 7.2 4.6 - 10.2 K/uL   Lymph, poc 3.1 0.6 - 3.4   POC LYMPH PERCENT 43.2 10 - 50 %L   MID (cbc) 0.4 0 - 0.9   POC MID % 4.9 0 - 12 %M   POC Granulocyte 3.7 2 - 6.9   Granulocyte percent 51.9 37 - 80 %G   RBC 4.77 4.04 - 5.48 M/uL   Hemoglobin 13.7 12.2 - 16.2 g/dL   HCT, POC 16.1 09.6 - 47.9 %   MCV 82.5 80 - 97 fL   MCH, POC 28.8 27 - 31.2 pg   MCHC 34.9 31.8 - 35.4 g/dL   RDW, POC 04.5 %   Platelet Count, POC 322 142 - 424 K/uL   MPV 7.5 0 - 99.8 fL    Assessment and Plan :   1. Annual physical exam - Medically healthy. Discussed healthy lifestyle, diet, exercise, preventative care, vaccinations, and addressed patient's concerns.   2. Need for Tdap vaccination - Tdap vaccine greater than or equal to 7yo IM  3. Tuberculosis screening - Quantiferon tb gold assay (blood) pending.   [name], PA-C [facility] [phone] 03/11/2016  6:09 PM

## 2016-03-11 NOTE — Patient Instructions (Signed)

## 2016-03-12 ENCOUNTER — Telehealth: Payer: Self-pay

## 2016-03-12 ENCOUNTER — Telehealth: Payer: Self-pay | Admitting: Medical

## 2016-03-12 NOTE — Telephone Encounter (Signed)
Patient states she would like a phone call soon as the lab results are in.  [phone]

## 2016-03-12 NOTE — Telephone Encounter (Signed)
Please call and inquire, remind patient what services we provide.   I just got a chart note from urgent care that she just had a physical there.   I'm a little suprised to see this.   Please remind her that we are primary care which means we should be her provider for physical, routine care unless it would be weekend or night or emergency which is the purpose of urgent care or ED.    I am surprised she didn't come here for physical.

## 2016-03-13 NOTE — Telephone Encounter (Signed)
Pt called back, I explained we can take care of almost anything she needs and she said she didn't know that.  She will see us from now on.

## 2016-03-13 NOTE — Telephone Encounter (Signed)
Called pt lmtrc 

## 2016-03-13 NOTE — Telephone Encounter (Signed)
Attempted to call pt about lab results. Message left with call back number.

## 2016-03-13 NOTE — Telephone Encounter (Signed)
Spoke with pt about lab results.

## 2016-03-14 LAB — QUANTIFERON TB GOLD ASSAY (BLOOD)
INTERFERON GAMMA RELEASE ASSAY: NEGATIVE
Quantiferon Nil Value: 0.07 IU/mL
Quantiferon Tb Ag Minus Nil Value: <0 IU/mL

## 2016-03-14 NOTE — Telephone Encounter (Signed)
PATIENT WOULD LIKE TO KNOW WHAT IS TAKING SO LONG FOR HER TB TEST BLOOD RESULTS TO COME BACK? SHE SAID IT HAS BEEN 3 DAYS. BEST PHONE [phone] (CELL)  MBC

## 2016-03-28 ENCOUNTER — Ambulatory Visit: Payer: BLUE CROSS/BLUE SHIELD | Admitting: Medical

## 2016-04-04 ENCOUNTER — Encounter: Payer: Self-pay | Admitting: Medical

## 2016-06-10 NOTE — Telephone Encounter (Signed)
Why am I getting this message? I see no message from  You?

## 2016-06-13 ENCOUNTER — Ambulatory Visit: Payer: BLUE CROSS/BLUE SHIELD

## 2016-06-14 ENCOUNTER — Ambulatory Visit (INDEPENDENT_AMBULATORY_CARE_PROVIDER_SITE_OTHER): Payer: BLUE CROSS/BLUE SHIELD | Admitting: Urgent Care

## 2016-06-14 ENCOUNTER — Ambulatory Visit (INDEPENDENT_AMBULATORY_CARE_PROVIDER_SITE_OTHER): Payer: BLUE CROSS/BLUE SHIELD

## 2016-06-14 VITALS — BP 116/82 | HR 80 | Temp 98.6°F | Resp 17 | Ht 63.0 in | Wt 152.0 lb

## 2016-06-14 DIAGNOSIS — K5900 Constipation, unspecified: Secondary | ICD-10-CM

## 2016-06-14 DIAGNOSIS — M545 Low back pain, unspecified: Secondary | ICD-10-CM

## 2016-06-14 LAB — POCT URINALYSIS DIP (MANUAL ENTRY)
BILIRUBIN UA: NEGATIVE
BILIRUBIN UA: NEGATIVE
Glucose, UA: NEGATIVE
NITRITE UA: NEGATIVE
PH UA: 5.5
PROTEIN UA: NEGATIVE
RBC UA: NEGATIVE
Spec Grav, UA: 1.025
Urobilinogen, UA: 0.2

## 2016-06-14 LAB — POCT URINE PREGNANCY: Preg Test, Ur: NEGATIVE

## 2016-06-14 MED ORDER — NAPROXEN SODIUM 550 MG PO TABS: 550.0000 mg | ORAL_TABLET | Freq: Two times a day (BID) | ORAL | 1 refills | Status: DC

## 2016-06-14 MED ORDER — CYCLOBENZAPRINE HCL 5 MG PO TABS: 5.0000 mg | ORAL_TABLET | Freq: Three times a day (TID) | ORAL | 1 refills | Status: DC | PRN
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
## 2016-06-14 NOTE — Patient Instructions (Addendum)
Back Pain, Adult Back pain is very common in adults.The cause of back pain is rarely dangerous and the pain often gets better over time.The cause of your back pain may not be known. Some common causes of back pain include:  Strain of the muscles or ligaments supporting the spine.  Wear and tear (degeneration) of the spinal disks.  Arthritis.  Direct injury to the back. For many people, back pain may return. Since back pain is rarely dangerous, most people can learn to manage this condition on their own. Follow these instructions at home: Watch your back pain for any changes. The following actions may help to lessen any discomfort you are feeling:  Remain active. It is stressful on your back to sit or stand in one place for long periods of time. Do not sit, drive, or stand in one place for more than 30 minutes at a time. Take short walks on even surfaces as soon as you are able.Try to increase the length of time you walk each day.  Exercise regularly as directed by your health care provider. Exercise helps your back heal faster. It also helps avoid future injury by keeping your muscles strong and flexible.  Do not stay in bed.Resting more than 1-2 days can delay your recovery.  Pay attention to your body when you bend and lift. The most comfortable positions are those that put less stress on your recovering back. Always use proper lifting techniques, including:  Bending your knees.  Keeping the load close to your body.  Avoiding twisting.  Find a comfortable position to sleep. Use a firm mattress and lie on your side with your knees slightly bent. If you lie on your back, put a pillow under your knees.  Avoid feeling anxious or stressed.Stress increases muscle tension and can worsen back pain.It is important to recognize when you are anxious or stressed and learn ways to manage it, such as with exercise.  Take medicines only as directed by your health care provider.  Over-the-counter medicines to reduce pain and inflammation are often the most helpful.Your health care provider may prescribe muscle relaxant drugs.These medicines help dull your pain so you can more quickly return to your normal activities and healthy exercise.  Apply ice to the injured area:  Put ice in a plastic bag.  Place a towel between your skin and the bag.  Leave the ice on for 20 minutes, 2-3 times a day for the first 2-3 days. After that, ice and heat may be alternated to reduce pain and spasms.  Maintain a healthy weight. Excess weight puts extra stress on your back and makes it difficult to maintain good posture. Contact a health care provider if:  You have pain that is not relieved with rest or medicine.  You have increasing pain going down into the legs or buttocks.  You have pain that does not improve in one week.  You have night pain.  You lose weight.  You have a fever or chills. Get help right away if:  You develop new bowel or bladder control problems.  You have unusual weakness or numbness in your arms or legs.  You develop nausea or vomiting.  You develop abdominal pain.  You feel faint. This information is not intended to replace advice given to you by your health care provider. Make sure you discuss any questions you have with your health care provider. Document Released: 06/09/2005 Document Revised: 10/18/2015 Document Reviewed: 10/11/2013 Elsevier Interactive Patient Education  2017 Elsevier   Inc.    Constipation, Adult Constipation is when a person has fewer bowel movements in a week than normal, has difficulty having a bowel movement, or has stools that are dry, hard, or larger than normal. Constipation may be caused by an underlying condition. It may become worse with age if a person takes certain medicines and does not take in enough fluids. Follow these instructions at home: Eating and drinking  Eat foods that have a lot of fiber, such as  fresh fruits and vegetables, whole grains, and beans.  Limit foods that are high in fat, low in fiber, or overly processed, such as french fries, hamburgers, cookies, candies, and soda.  Drink enough fluid to keep your urine clear or pale yellow. General instructions  Exercise regularly or as told by your health care provider.  Go to the restroom when you have the urge to go. Do not hold it in.  Take over-the-counter and prescription medicines only as told by your health care provider. These include any fiber supplements.  Practice pelvic floor retraining exercises, such as deep breathing while relaxing the lower abdomen and pelvic floor relaxation during bowel movements.  Watch your condition for any changes.  Keep all follow-up visits as told by your health care provider. This is important. Contact a health care provider if:  You have pain that gets worse.  You have a fever.  You do not have a bowel movement after 4 days.  You vomit.  You are not hungry.  You lose weight.  You are bleeding from the anus.  You have thin, pencil-like stools. Get help right away if:  You have a fever and your symptoms suddenly get worse.  You leak stool or have blood in your stool.  Your abdomen is bloated.  You have severe pain in your abdomen.  You feel dizzy or you faint. This information is not intended to replace advice given to you by your health care provider. Make sure you discuss any questions you have with your health care provider. Document Released: 03/07/2004 Document Revised: 12/28/2015 Document Reviewed: 11/28/2015 Elsevier Interactive Patient Education  2017 [employer]   IF you received an x-ray today, you will receive an invoice from [facility]. Please contact [facility] at [phone] with questions or concerns regarding your invoice.   IF you received labwork today, you will receive an invoice from [facility]. Please contact [facility] at  [phone] with questions or concerns regarding your invoice.   Our billing staff will not be able to assist you with questions regarding bills from these companies.  You will be contacted with the lab results as soon as they are available. The fastest way to get your results is to activate your My Chart account. Instructions are located on the last page of this paperwork. If you have not heard from us regarding the results in 2 weeks, please contact this office.

## 2016-06-21 ENCOUNTER — Ambulatory Visit (INDEPENDENT_AMBULATORY_CARE_PROVIDER_SITE_OTHER): Payer: BLUE CROSS/BLUE SHIELD | Admitting: Emergency Medicine

## 2016-06-21 VITALS — BP 118/70 | HR 70 | Temp 98.7°F | Resp 16 | Ht 63.0 in | Wt 154.0 lb

## 2016-06-21 DIAGNOSIS — M545 Low back pain, unspecified: Secondary | ICD-10-CM

## 2016-06-21 MED ORDER — DICLOFENAC SODIUM 75 MG PO TBEC: 75.0000 mg | DELAYED_RELEASE_TABLET | Freq: Two times a day (BID) | ORAL | 0 refills | Status: AC

## 2016-06-21 MED ORDER — METAXALONE 800 MG PO TABS: 800.0000 mg | ORAL_TABLET | Freq: Three times a day (TID) | ORAL | 0 refills | Status: AC

## 2016-06-21 NOTE — Patient Instructions (Addendum)
     IF you received an x-ray today, you will receive an invoice from [facility]. Please contact [facility] at [phone] with questions or concerns regarding your invoice.   IF you received labwork today, you will receive an invoice from [facility]. Please contact [facility] at [phone] with questions or concerns regarding your invoice.   Our billing staff will not be able to assist you with questions regarding bills from these companies.  You will be contacted with the lab results as soon as they are available. The fastest way to get your results is to activate your My Chart account. Instructions are located on the last page of this paperwork. If you have not heard from us regarding the results in 2 weeks, please contact this office.      Back Pain, Adult Introduction Back pain is very common. The pain often gets better over time. The cause of back pain is usually not dangerous. Most people can learn to manage their back pain on their own. Follow these instructions at home: Watch your back pain for any changes. The following actions may help to lessen any pain you are feeling:  Stay active. Start with short walks on flat ground if you can. Try to walk farther each day.  Exercise regularly as told by your doctor. Exercise helps your back heal faster. It also helps avoid future injury by keeping your muscles strong and flexible.  Do not sit, drive, or stand in one place for more than 30 minutes.  Do not stay in bed. Resting more than 1-2 days can slow down your recovery.  Be careful when you bend or lift an object. Use good form when lifting:  Bend at your knees.  Keep the object close to your body.  Do not twist.  Sleep on a firm mattress. Lie on your side, and bend your knees. If you lie on your back, put a pillow under your knees.  Take medicines only as told by your doctor.  Put ice on the injured area.  Put ice in a plastic bag.  Place a towel  between your skin and the bag.  Leave the ice on for 20 minutes, 2-3 times a day for the first 2-3 days. After that, you can switch between ice and heat packs.  Avoid feeling anxious or stressed. Find good ways to deal with stress, such as exercise.  Maintain a healthy weight. Extra weight puts stress on your back. Contact a doctor if:  You have pain that does not go away with rest or medicine.  You have worsening pain that goes down into your legs or buttocks.  You have pain that does not get better in one week.  You have pain at night.  You lose weight.  You have a fever or chills. Get help right away if:  You cannot control when you poop (bowel movement) or pee (urinate).  Your arms or legs feel weak.  Your arms or legs lose feeling (numbness).  You feel sick to your stomach (nauseous) or throw up (vomit).  You have belly (abdominal) pain.  You feel like you may pass out (faint). This information is not intended to replace advice given to you by your health care provider. Make sure you discuss any questions you have with your health care provider. Stop Flexeril and Anaprox. Will instead try Skelaxin and Diclofenac. Document Released: 11/26/2007 Document Revised: 11/15/2015 Document Reviewed: 10/11/2013  2017 Elsevier
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
## 2016-08-18 ENCOUNTER — Ambulatory Visit: Payer: BLUE CROSS/BLUE SHIELD

## 2016-08-20 ENCOUNTER — Ambulatory Visit (INDEPENDENT_AMBULATORY_CARE_PROVIDER_SITE_OTHER): Payer: BLUE CROSS/BLUE SHIELD | Admitting: Family Medicine

## 2016-08-20 ENCOUNTER — Telehealth: Payer: Self-pay | Admitting: Medical

## 2016-08-20 VITALS — BP 110/66 | HR 96 | Temp 98.3°F | Resp 16 | Ht 62.75 in | Wt 153.6 lb

## 2016-08-20 DIAGNOSIS — F909 Attention-deficit hyperactivity disorder, unspecified type: Secondary | ICD-10-CM

## 2016-08-20 DIAGNOSIS — F411 Generalized anxiety disorder: Secondary | ICD-10-CM | POA: Diagnosis not present

## 2016-08-20 MED ORDER — AMPHETAMINE-DEXTROAMPHETAMINE 20 MG PO TABS: 20.0000 mg | ORAL_TABLET | Freq: Two times a day (BID) | ORAL | 0 refills | Status: DC

## 2016-08-20 MED ORDER — LORAZEPAM 0.5 MG PO TABS: 0.5000 mg | ORAL_TABLET | Freq: Two times a day (BID) | ORAL | 0 refills | Status: AC | PRN

## 2016-08-20 NOTE — Patient Instructions (Addendum)
It was good to meet you today.  Take the Ativan once during an anxiety attack.  If you're doing well, you don't have to take this.    I have refilled your Adderall.  Monitor to see if this is making your anxiety worse.  I have referred you to a psychologist as well.  They will contact you.  Come back to see us in 4 - 6 weeks to see how you're doing.     IF you received an x-ray today, you will receive an invoice from [id number]. Please contact [facility] at [phone] with questions or concerns regarding your invoice.   IF you received labwork today, you will receive an invoice from [facility]. Please contact [facility] at [phone] with questions or concerns regarding your invoice.   Our billing staff will not be able to assist you with questions regarding bills from these companies.  You will be contacted with the lab results as soon as they are available. The fastest way to get your results is to activate your My Chart account. Instructions are located on the last page of this paperwork. If you have not heard from us regarding the results in 2 weeks, please contact this office.

## 2016-08-20 NOTE — Telephone Encounter (Signed)
pls remove me as PCP as she is going to [facility] for everything it seems
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
## 2016-09-01 ENCOUNTER — Telehealth: Payer: Self-pay | Admitting: Family Medicine

## 2016-09-01 NOTE — Telephone Encounter (Signed)
Returned pts call regarding her referral to [facility] and insurance costs. Will send to [facility] as an alternative unless pt is aware of another office that will have reduced costs through her ins.

## 2016-09-30 ENCOUNTER — Telehealth: Payer: Self-pay | Admitting: General Practice

## 2016-09-30 NOTE — Telephone Encounter (Signed)
08/20/16 last refill and ov

## 2016-09-30 NOTE — Telephone Encounter (Signed)
I'll be back in clinic tomorrow and can complete this then.  Thanks, JW

## 2016-09-30 NOTE — Telephone Encounter (Signed)
Pt is needing a refill on her adderall  Best number is [phone]

## 2016-10-01 MED ORDER — AMPHETAMINE-DEXTROAMPHETAMINE 20 MG PO TABS: 20.0000 mg | ORAL_TABLET | Freq: Two times a day (BID) | ORAL | 0 refills | Status: AC

## 2016-10-01 NOTE — Telephone Encounter (Signed)
Pt advised medication is ready for pick up and she will need to schedule OV for further refills Verbalized understanding

## 2016-10-01 NOTE — Telephone Encounter (Signed)
I have completed this and left up front in the Rx hanging folder.  She will need an OV to be seen again before her next refill, this was discussed at her Feb visit.  We need to check on her anxiety and make sure she's doing okay and that the ADHD meds haven't worsened anything.

## 2018-10-15 IMAGING — DX DG LUMBAR SPINE COMPLETE 4+V
5 series · 5 of 5 positions shown · non-contrast
Comparison: None.

CLINICAL DATA: Low back pain radiating to left side.

EXAM:
LUMBAR SPINE - COMPLETE 4+ VIEW

[l-spine ap]
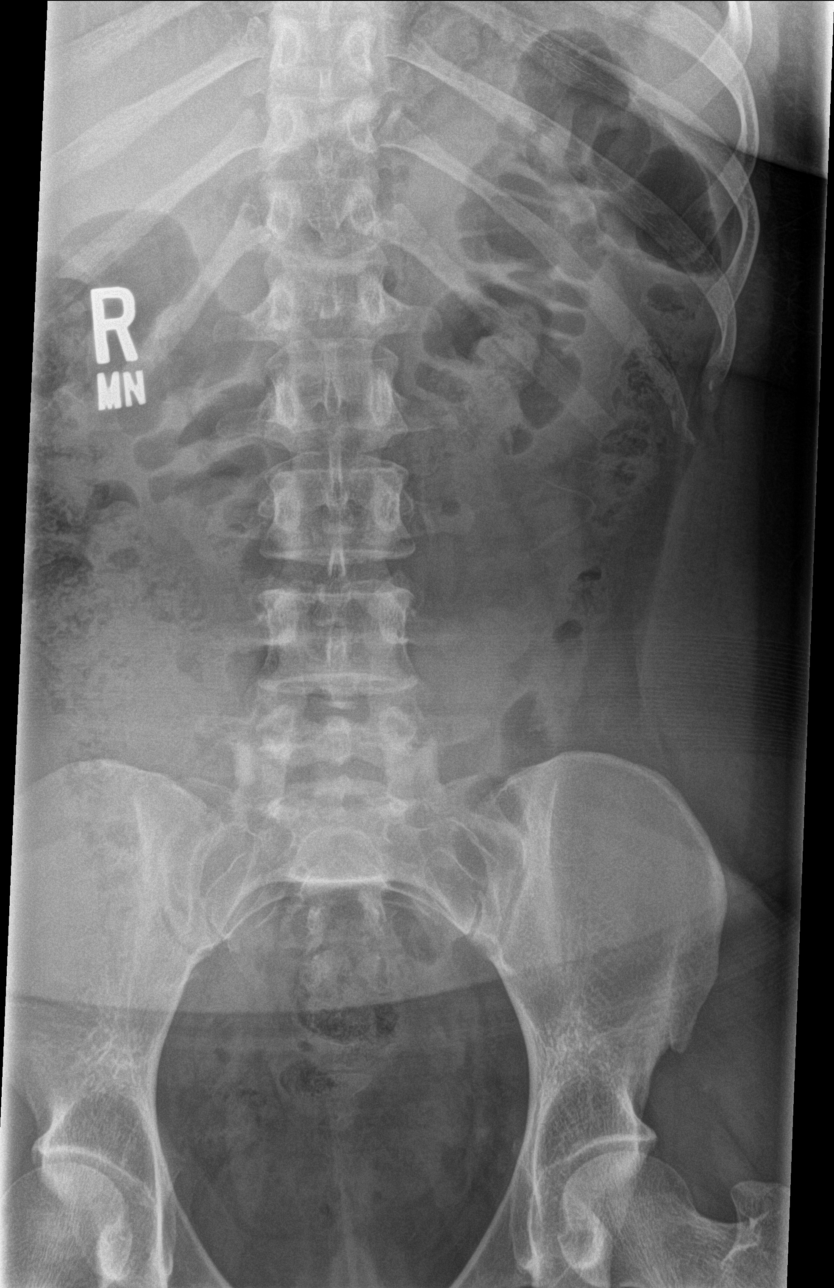

[l-spine obl (1 of 2)]
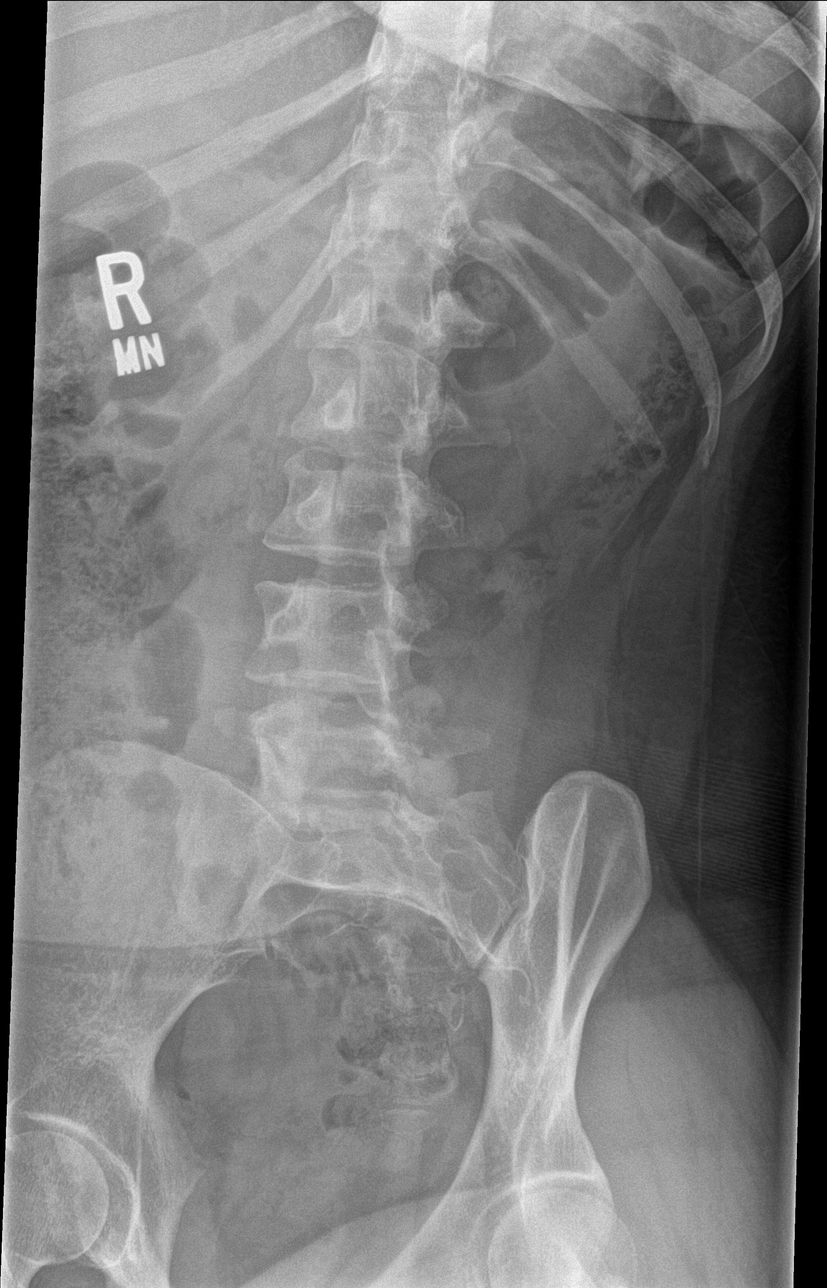

[l-spine obl (2 of 2)]
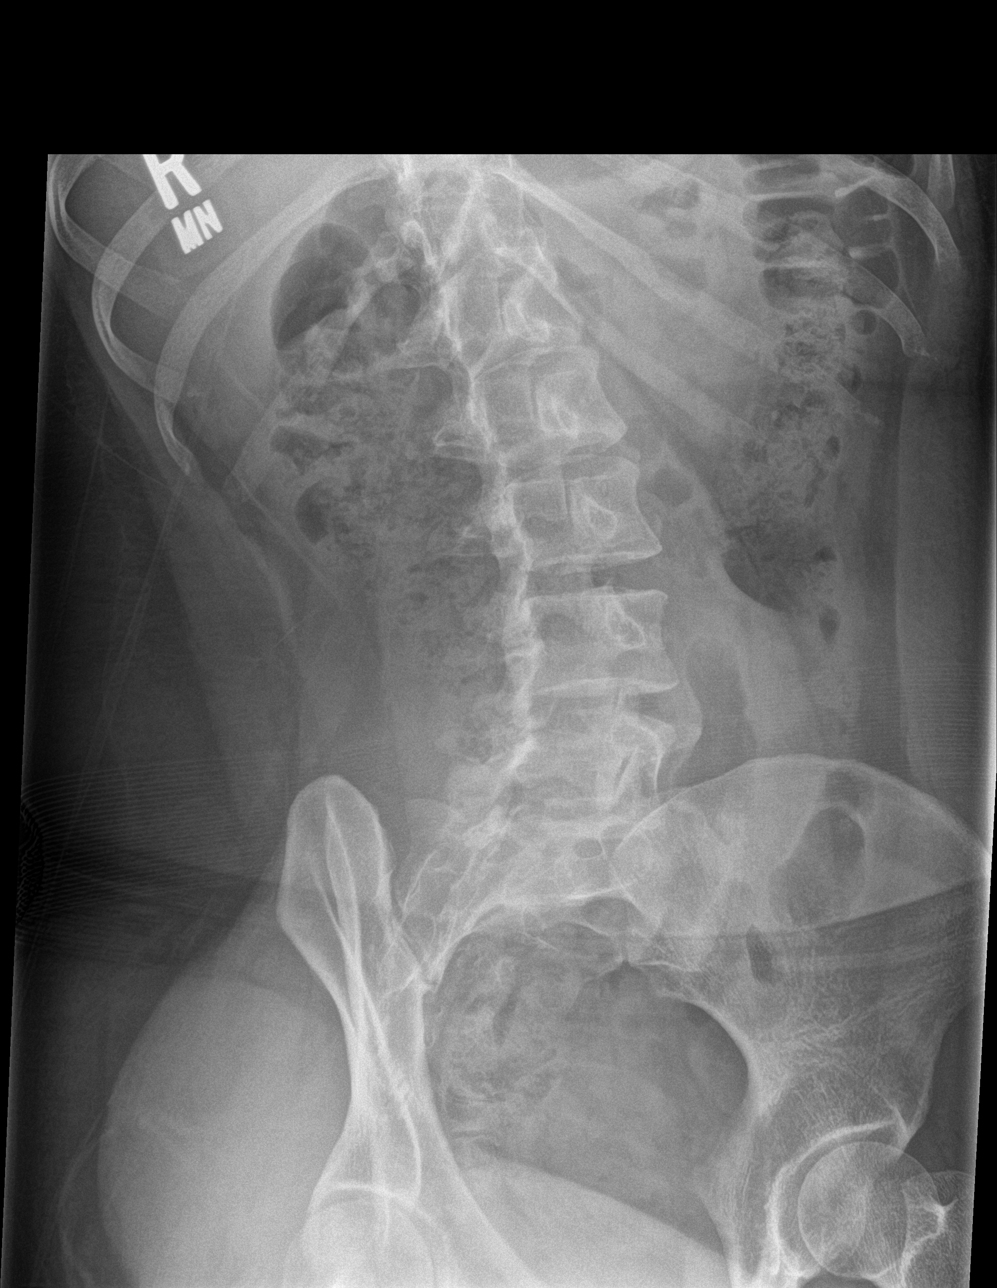

[l-spine lat]
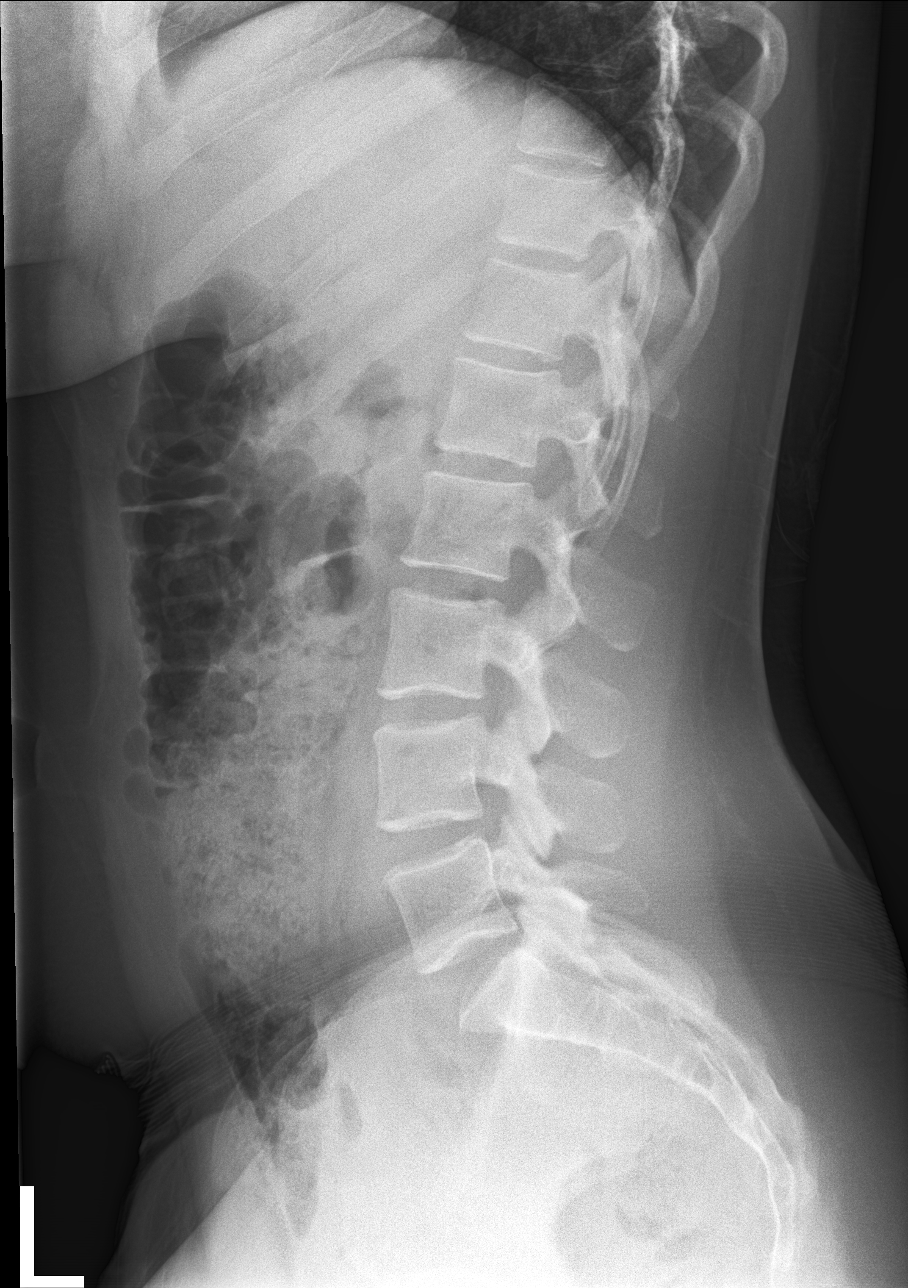

[l-spine l5-s1]
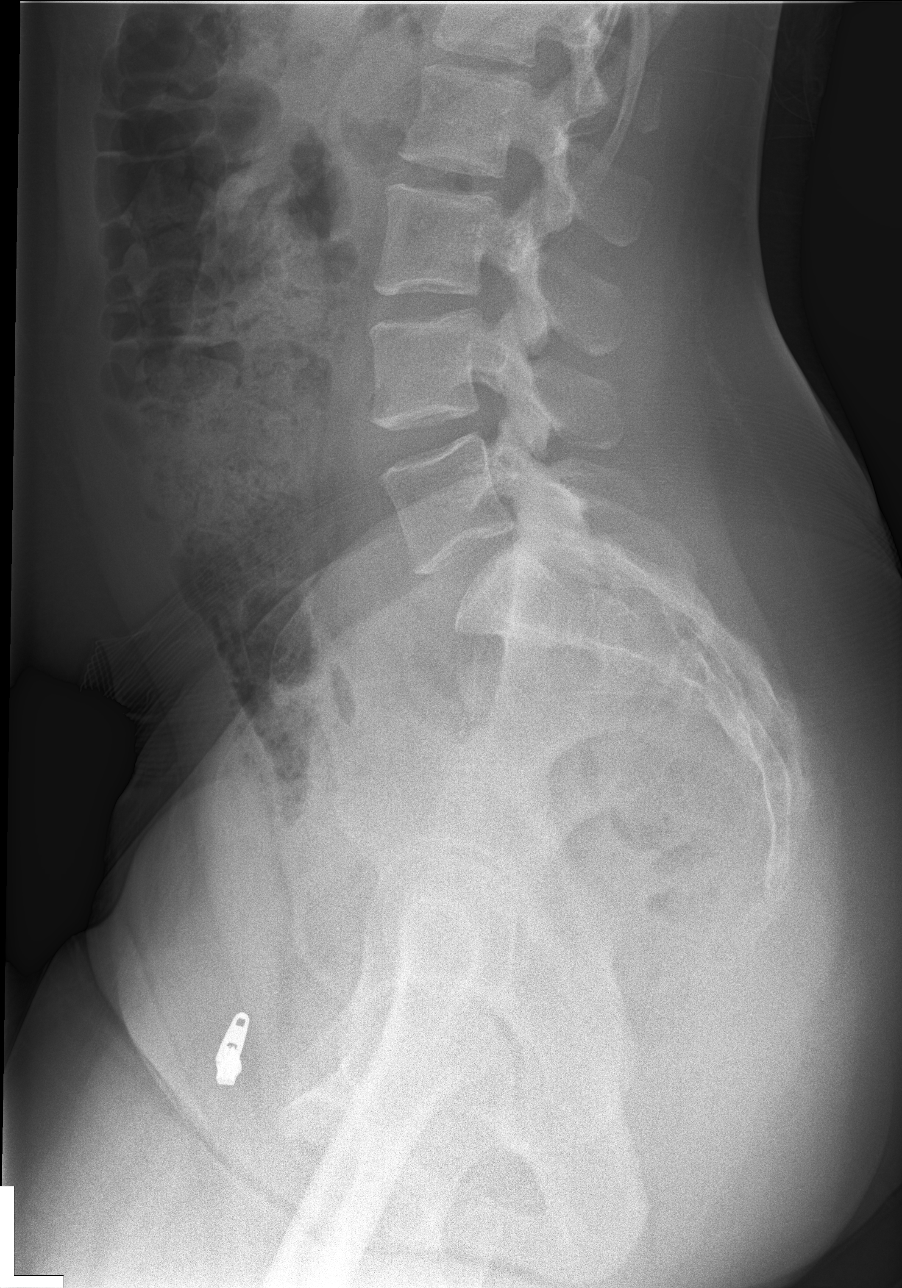

[5 of 5 positions shown; findings below may reference images not displayed]

FINDINGS: There is no evidence of lumbar spine fracture. Alignment is normal.
Intervertebral disc spaces are maintained.
IMPRESSION: Negative.
